# Patient Record
Sex: Male | Born: 1965 | Race: White | Hispanic: No | Marital: Single | Smoking: Current every day smoker
Health system: Southern US, Community
[De-identification: ages and names within clinical notes are randomized; demographics above are authoritative.]

## PROBLEM LIST (undated history)

## (undated) DIAGNOSIS — S149XXA Injury of unspecified nerves of neck, initial encounter: Secondary | ICD-10-CM

## (undated) DIAGNOSIS — G589 Mononeuropathy, unspecified: Secondary | ICD-10-CM

## (undated) DIAGNOSIS — R569 Unspecified convulsions: Secondary | ICD-10-CM

---

## 2018-04-10 ENCOUNTER — Emergency Department (HOSPITAL_COMMUNITY): Payer: Self-pay

## 2018-04-10 ENCOUNTER — Emergency Department (HOSPITAL_COMMUNITY)
Admission: EM | Admit: 2018-04-10 | Discharge: 2018-04-11 | Disposition: A | Discharge status: Home / Self Care | Payer: Self-pay | Attending: Emergency Medicine | Admitting: Emergency Medicine

## 2018-04-10 ENCOUNTER — Other Ambulatory Visit: Payer: Self-pay

## 2018-04-10 ENCOUNTER — Encounter (HOSPITAL_COMMUNITY): Payer: Self-pay

## 2018-04-10 DIAGNOSIS — F250 Schizoaffective disorder, bipolar type: Secondary | ICD-10-CM

## 2018-04-10 DIAGNOSIS — R0602 Shortness of breath: Secondary | ICD-10-CM | POA: Insufficient documentation

## 2018-04-10 DIAGNOSIS — R443 Hallucinations, unspecified: Secondary | ICD-10-CM

## 2018-04-10 DIAGNOSIS — Z046 Encounter for general psychiatric examination, requested by authority: Secondary | ICD-10-CM | POA: Insufficient documentation

## 2018-04-10 DIAGNOSIS — R440 Auditory hallucinations: Secondary | ICD-10-CM | POA: Insufficient documentation

## 2018-04-10 DIAGNOSIS — R45851 Suicidal ideations: Secondary | ICD-10-CM | POA: Insufficient documentation

## 2018-04-10 DIAGNOSIS — F329 Major depressive disorder, single episode, unspecified: Secondary | ICD-10-CM | POA: Insufficient documentation

## 2018-04-10 DIAGNOSIS — F17210 Nicotine dependence, cigarettes, uncomplicated: Secondary | ICD-10-CM | POA: Insufficient documentation

## 2018-04-10 DIAGNOSIS — R05 Cough: Secondary | ICD-10-CM | POA: Insufficient documentation

## 2018-04-10 DIAGNOSIS — E876 Hypokalemia: Secondary | ICD-10-CM | POA: Insufficient documentation

## 2018-04-10 DIAGNOSIS — Z590 Homelessness: Secondary | ICD-10-CM | POA: Insufficient documentation

## 2018-04-10 HISTORY — DX: Mononeuropathy, unspecified: G58.9

## 2018-04-10 HISTORY — DX: Injury of unspecified nerves of neck, initial encounter: S14.9XXA

## 2018-04-10 HISTORY — DX: Unspecified convulsions: R56.9

## 2018-04-10 LAB — CBC WITH DIFFERENTIAL/PLATELET
BASOS ABS: 0.1 10*3/uL (ref 0.0–0.1)
Basophils Relative: 1 %
EOS ABS: 0.3 10*3/uL (ref 0.0–0.7)
Eosinophils Relative: 4 %
HCT: 41.9 % (ref 39.0–52.0)
Hemoglobin: 13.9 g/dL (ref 13.0–17.0)
LYMPHS ABS: 2.1 10*3/uL (ref 0.7–4.0)
LYMPHS PCT: 23 %
MCH: 32.4 pg (ref 26.0–34.0)
MCHC: 33.2 g/dL (ref 30.0–36.0)
MCV: 97.7 fL (ref 78.0–100.0)
Monocytes Absolute: 0.8 10*3/uL (ref 0.1–1.0)
Monocytes Relative: 9 %
NEUTROS ABS: 5.8 10*3/uL (ref 1.7–7.7)
Neutrophils Relative %: 63 %
PLATELETS: 313 10*3/uL (ref 150–400)
RBC: 4.29 MIL/uL (ref 4.22–5.81)
RDW: 14.8 % (ref 11.5–15.5)
WBC: 9.1 10*3/uL (ref 4.0–10.5)

## 2018-04-10 LAB — RAPID URINE DRUG SCREEN, HOSP PERFORMED
Amphetamines: NOT DETECTED
Barbiturates: NOT DETECTED
Benzodiazepines: NOT DETECTED
Cocaine: NOT DETECTED
Opiates: NOT DETECTED
Tetrahydrocannabinol: POSITIVE — AB

## 2018-04-10 LAB — COMPREHENSIVE METABOLIC PANEL
ALT: 16 U/L (ref 0–44)
AST: 20 U/L (ref 15–41)
Albumin: 3.8 g/dL (ref 3.5–5.0)
Alkaline Phosphatase: 62 U/L (ref 38–126)
Anion gap: 8 (ref 5–15)
BILIRUBIN TOTAL: 0.4 mg/dL (ref 0.3–1.2)
BUN: 20 mg/dL (ref 6–20)
CALCIUM: 8.8 mg/dL — AB (ref 8.9–10.3)
CO2: 30 mmol/L (ref 22–32)
CREATININE: 0.99 mg/dL (ref 0.61–1.24)
Chloride: 107 mmol/L (ref 98–111)
GFR calc Af Amer: >60 mL/min (ref >60)
Glucose, Bld: 113 mg/dL — ABNORMAL HIGH (ref 70–99)
Potassium: 3.3 mmol/L — ABNORMAL LOW (ref 3.5–5.1)
Sodium: 145 mmol/L (ref 135–145)
Total Protein: 7.1 g/dL (ref 6.5–8.1)

## 2018-04-10 LAB — ACETAMINOPHEN LEVEL

## 2018-04-10 LAB — ETHANOL

## 2018-04-10 LAB — SALICYLATE LEVEL

## 2018-04-10 MED ORDER — POTASSIUM CHLORIDE CRYS ER 20 MEQ PO TBCR
40.0000 meq | EXTENDED_RELEASE_TABLET | Freq: Once | ORAL | Status: AC
  Administered 2018-04-10: 40 meq via ORAL
  Filled 2018-04-10: qty 2

## 2018-04-10 MED ORDER — LEVETIRACETAM 500 MG PO TABS
750.0000 mg | ORAL_TABLET | Freq: Two times a day (BID) | ORAL | Status: DC
  Administered 2018-04-10 – 2018-04-11 (×2): 750 mg via ORAL
  Filled 2018-04-10 (×2): qty 1

## 2018-04-10 MED ORDER — ACETAMINOPHEN 325 MG PO TABS: 650.0000 mg | ORAL_TABLET | ORAL | Status: DC | PRN

## 2018-04-10 MED ORDER — HALOPERIDOL 5 MG PO TABS
10.0000 mg | ORAL_TABLET | Freq: Two times a day (BID) | ORAL | Status: DC
  Administered 2018-04-10 – 2018-04-11 (×2): 10 mg via ORAL
  Filled 2018-04-10 (×2): qty 2

## 2018-04-10 MED ORDER — NICOTINE 21 MG/24HR TD PT24
21.0000 mg | MEDICATED_PATCH | Freq: Every day | TRANSDERMAL | Status: DC
  Administered 2018-04-10 – 2018-04-11 (×2): 21 mg via TRANSDERMAL
  Filled 2018-04-10 (×2): qty 1

## 2018-04-10 MED ORDER — TRAZODONE HCL 50 MG PO TABS
50.0000 mg | ORAL_TABLET | Freq: Every day | ORAL | Status: DC
  Administered 2018-04-10: 50 mg via ORAL
  Filled 2018-04-10: qty 1

## 2018-04-10 MED ORDER — BENZTROPINE MESYLATE 1 MG PO TABS
1.0000 mg | ORAL_TABLET | Freq: Two times a day (BID) | ORAL | Status: DC
  Administered 2018-04-10 – 2018-04-11 (×2): 1 mg via ORAL
  Filled 2018-04-10 (×2): qty 1

## 2018-04-10 MED ORDER — HYDROXYZINE HCL 25 MG PO TABS: 25.0000 mg | ORAL_TABLET | Freq: Three times a day (TID) | ORAL | Status: DC | PRN

## 2018-04-10 NOTE — ED Notes (Signed)
Patient was brought in by his parole officer and currently has a ankle bracelet on.

## 2018-04-10 NOTE — ED Triage Notes (Addendum)
Patient reports that he has medications for hallucinations and has been taking them like he is suppose to. Patient states, The medicine has made the voices more quiet,but has not taken them away.I figure I could kill myself and make them go away." Patient states the voices tell him to kill himself and others." Patient reports that his plan is to "[place] or walk out in front of traffic." Patient denies any alcohol or drug use. Patient denies any HI thoughts and states, "It would not help the situation."

## 2018-04-10 NOTE — ED Provider Notes (Signed)
[facility]   CSN: [id number] Arrival date & time: 04/10/18  1337     History   Chief Complaint Chief Complaint  Patient presents with  . Suicidal    HPI Deandrae Wajda is a 52 y.o. male.  Avi Kerschner is a 52 y.o. Male with a history of seizures, hallucinations and depression, presents to the department for evaluation of suicidal ideations.  Patient reports that he is having persistent hallucinations, he intermittently has command auditory hallucinations that tell him to kill himself or others.  He reports despite being on regular medications and taking them as directed these voices have quieted down but still persists.  He reports he just cannot go on like this anymore and he wants to die.  He plans to jump in front of a truck or jump off of a bridge at the [place] station.  He denies HI.  Reports regular cigarette use, denies other drugs.  Denies any focal medical complaints today, no recent fevers or illnesses.  Reports that because of smoking he has some mild chronic shortness of breath and intermittent cough, denies chest pain, abdominal pain, nausea or vomiting, no headaches or vision changes.  Reports history of pinched nerve in his neck with intermittent pains associated with that but this is unchanged, and he denies any numbness or weakness in his extremities.     Past Medical History:  Diagnosis Date  . Pinched nerve in neck   . Seizures (HCC)     There are no active problems to display for this patient.   History reviewed. No pertinent surgical history.      Home Medications    Prior to Admission medications   Not on File    Family History Family History  Family history unknown: Yes    Social History Social History   Tobacco Use  . Smoking status: Current Every Day Smoker    Packs/day: 2.00    Types: Cigarettes  . Smokeless tobacco: Never Used  Substance Use Topics  . Alcohol use: Not Currently      Frequency: Never  . Drug use: Not Currently     Allergies   Patient has no known allergies.   Review of Systems Review of Systems  Constitutional: Negative for chills and fever.  HENT: Negative.   Eyes: Negative for visual disturbance.  Respiratory: Positive for shortness of breath. Negative for cough.   Cardiovascular: Negative for chest pain.  Gastrointestinal: Negative for abdominal pain, nausea and vomiting.  Genitourinary: Negative for dysuria.  Musculoskeletal: Negative for arthralgias and myalgias.  Skin: Negative for color change, rash and wound.  Neurological: Negative for dizziness, seizures, weakness, numbness and headaches.  Psychiatric/Behavioral: Positive for dysphoric mood, hallucinations and suicidal ideas.     Physical Exam Updated Vital Signs BP 115/62 (BP Location: Left Arm)   Pulse 91   Temp 97.9 F (36.6 C) (Oral)   Resp 16   Ht 6\' 1"  (1.854 m)   Wt 74.4 kg   SpO2 97%   BMI 21.64 kg/m   Physical Exam  Constitutional: He appears well-developed and well-nourished. No distress.  HENT:  Head: Normocephalic and atraumatic.  Mouth/Throat: Oropharynx is clear and moist.  Eyes: Pupils are equal, round, and reactive to light. EOM are normal. Right eye exhibits no discharge. Left eye exhibits no discharge.  Neck: Neck supple.  Cardiovascular: Normal rate, regular rhythm, normal heart sounds and intact distal pulses.  Pulmonary/Chest: Effort normal and breath sounds normal. No respiratory  distress. He has no wheezes. He has no rales.  Respirations equal and unlabored, patient able to speak in full sentences, lungs clear to auscultation bilaterally  Abdominal: Soft. Bowel sounds are normal. He exhibits no distension and no mass. There is no tenderness. There is no guarding.  Abdomen soft, nondistended, nontender to palpation in all quadrants without guarding or peritoneal signs  Musculoskeletal: He exhibits no edema or deformity.  Neurological: He is  alert. Coordination normal.  Speech is clear, able to follow commands CN III-XII intact Normal strength in upper and lower extremities bilaterally including dorsiflexion and plantar flexion, strong and equal grip strength Sensation normal to light and sharp touch Moves extremities without ataxia, coordination intact  Skin: Skin is warm and dry. Capillary refill takes less than 2 seconds. He is not diaphoretic.  Psychiatric: His speech is normal. He is withdrawn and actively hallucinating. He exhibits a depressed mood. He expresses suicidal ideation. He expresses no homicidal ideation. He expresses suicidal plans. He expresses no homicidal plans.  Nursing note and vitals reviewed.    ED Treatments / Results  Labs (all labs ordered are listed, but only abnormal results are displayed) Labs Reviewed  COMPREHENSIVE METABOLIC PANEL - Abnormal; Notable for the following components:      Result Value   Potassium 3.3 (*)    Glucose, Bld 113 (*)    Calcium 8.8 (*)    All other components within normal limits  CBC WITH DIFFERENTIAL/PLATELET  ETHANOL  RAPID URINE DRUG SCREEN, HOSP PERFORMED  ACETAMINOPHEN LEVEL  SALICYLATE LEVEL    EKG EKG Interpretation  Date/Time:  Sunday April 10 2018 15:05:24 EDT Ventricular Rate:  74 PR Interval:    QRS Duration: 91 QT Interval:  381 QTC Calculation: 423 R Axis:   91 Text Interpretation:  Sinus rhythm Borderline right axis deviation early repolarization. no ischemic appearance. no old comarison Confirmed by Arby Barrette [phone]) on 04/10/2018 3:21:26 PM   Radiology Dg Chest 2 View  Result Date: 04/10/2018 CLINICAL DATA:  Cough.  Auditory hallucinations EXAM: CHEST - 2 VIEW COMPARISON:  None. FINDINGS: There is slight scarring in the apices. There is no edema or consolidation. The heart size and pulmonary vascularity are normal. No adenopathy. No evident bone lesions. IMPRESSION: Slight apical scarring bilaterally. No edema or consolidation.  No evident adenopathy. Electronically Signed   By: Bretta Bang III M.D.   On: 04/10/2018 15:28    Procedures Procedures (including critical care time)  Medications Ordered in ED Medications  potassium chloride SA (K-DUR,KLOR-CON) CR tablet 40 mEq (has no administration in time range)     Initial Impression / Assessment and Plan / ED Course  I have reviewed the triage vital signs and the nursing notes.  Pertinent labs & imaging results that were available during my care of the patient were reviewed by me and considered in my medical decision making (see chart for details).  Presents with suicidal ideations with plan to jump off of a bridge at the [place] station or run out in front of a truck.  He has been having persistent hallucinations despite taking his medications regularly and reports he just cannot take it anymore.  Patient is acute danger to himself and will require psychiatric evaluation.  He denies any focal medical complaints reports some chronic shortness of breath associated with smoking.  No recent fevers or illnesses.  Denies chest pain, no abdominal pain, nausea, vomiting, headaches or vision changes.  No focal findings on exam and vitals are stable  here in the ED. Will get EKG medical screening labs and chest x-ray.  TTS consult placed.  EKG without concerning changes.  Chest x-ray shows some chronic biapical scarring but no evidence of acute cardiopulmonary disease.  No leukocytosis and normal hemoglobin mild hypokalemia which was replaced with oral potassium here in the ED no other acute electrolyte drink mention, normal renal and liver function.  UDS pending, salicylate, acetaminophen and ethanol levels unremarkable  At this time patient is medically cleared for psychiatric evaluation he has been placed under ED psych hold.  Awaiting TTS recommendations for appropriate disposition.  Final Clinical Impressions(s) / ED Diagnoses   Final diagnoses:  Suicidal ideation    Hallucinations  Hypokalemia    ED Discharge Orders    None       Dartha Lodge, [place] 04/10/18 1601    Arby Barrette, MD 04/20/18 [phone]

## 2018-04-10 NOTE — BH Assessment (Signed)
Tele Assessment Note   Patient Name: Joel Herman MRN: [id number] Referring Physician: Legrand Rams Location of Patient: [facility] Location of Provider: Behavioral Health TTS Department  Kartier Bennison is an 52 y.o. male present to [facility] accompanied by his parole officer with complaints of auditory / visual hallucinations. Patient has a mental health history of schizoaffective disorder, Bipolar Type. Patient started hearing and seeing shadows around the age of 5-years-old. Patient has attempted suicide once age 31-year-old via attempting to hang himself. Patient threatened suicide in 2005 or 2006 via threaten to shoot himself. Stated he had the gun to his head when his sister walked in the room and stopped him. Each suicidal ideation triggered by hearing voices and seeing shadows. Patient stated the voices instructs him to do bad things or tells him degrading things such as, 'your worthless, no one cares about you and your wasting your time living.' Patient recently released from prison February 07, 2018, after serving 11-years for assault with a deadly weapon by vehicle, hit and run and driving while license revoked. Patient has been without medication since released from prison. Report he has only been on his medication 3 days prescribed by Dr. Leonard Schwartz at [facility] in [facility]. Medication prescribed Haldol, Cogentin, and Keppra, patient unsure of dosage.Patient denies homicidal ideations. Suicidal ideations triggered by auditory / visual hallucinations. Patient unable to contract for safety.   Patient present anxious. Report he has been experiencing auditory / visual hallucinations with suicidal ideations past two weeks. Patient is homeless, he sleeps where he can and eats when he can. Patient denies having a support system. He's on probation post release for 18-months. Patient oriented 4x. Report he drinking alcohol and smokes marijuana daily, UDS's unable during time of assessment. Denies history of trauma.    Disposition: Elta Guadeloupe, NP, patient meets criteria for inpatient hospitalization      Diagnosis: F25.0   Schizoaffective disorder, Bipolar type  Past Medical History:  Past Medical History:  Diagnosis Date  . Pinched nerve in neck   . Seizures (HCC)     History reviewed. No pertinent surgical history.  Family History:  Family History  Family history unknown: Yes    Social History:  reports that he has been smoking cigarettes. He has been smoking about 2.00 packs per day. He has never used smokeless tobacco. He reports that he drank alcohol. He reports that he has current or past drug history.  Additional Social History:  Alcohol / Drug Use Pain Medications: see MAR Prescriptions: see MAR Over the Counter: see MAR History of alcohol / drug use?: Yes Longest period of sobriety (when/how long): n/a Substance #1 Name of Substance 1: THC 1 - Age of First Use: 14 1 - Amount (size/oz): varies 1 - Frequency: daily  1 - Duration: ongoing  1 - Last Use / Amount: 04/08/2018 Substance #2 Name of Substance 2: Alcohol  2 - Age of First Use: 14 2 - Amount (size/oz): varies 2 - Frequency: daily  2 - Duration: ongoing  2 - Last Use / Amount: 04/09/2018  CIWA: CIWA-Ar BP: 115/62 Pulse Rate: 91 COWS:    Allergies: No Known Allergies  Home Medications:  (Not in a hospital admission)  OB/GYN Status:  No LMP for male patient.  General Assessment Data Location of Assessment: [facility] TTS Assessment: In system Is this a Tele or Face-to-Face Assessment?: Face-to-Face Is this an Initial Assessment or a Re-assessment for this encounter?: Initial Assessment Patient Accompanied by:: N/A(probation officer ) Language  Other than English: No Living Arrangements: Homeless/Shelter What gender do you identify as?: Male Marital status: Single Maiden name: n/a Pregnancy Status: No Living Arrangements: Other (Comment)(homeless) Can pt return to current living arrangement?:  Yes Admission Status: Voluntary Is patient capable of signing voluntary admission?: Yes Referral Source: Self/Family/Friend Insurance type: self-pay     Crisis Care Plan Living Arrangements: Other (Comment)(homeless) Legal Guardian: Other:(self) Name of Psychiatrist: Dr. Leonard Schwartz - [facility] in [facility]  Name of Therapist: patient denies  Education Status Is patient currently in school?: No Is the patient employed, unemployed or receiving disability?: Unemployed  Risk to self with the past 6 months Suicidal Ideation: Yes-Currently Present(SI - triggered by hearing voices) Has patient been a risk to self within the past 6 months prior to admission? : Yes(patient report feeling SI triggered by voices 2 wks ) Suicidal Intent: No Has patient had any suicidal intent within the past 6 months prior to admission? : No Is patient at risk for suicide?: Yes(SI thoughts triggered by voices ) Suicidal Plan?: Yes-Currently Present Has patient had any suicidal plan within the past 6 months prior to admission? : No Specify Current Suicidal Plan: [place] Access to Means: Yes Specify Access to Suicidal Means: pt can walk to train tracks  What has been your use of drugs/alcohol within the last 12 months?: alcohol & THC  Previous Attempts/Gestures: Yes How many times?: 2 Other Self Harm Risks: pt denies  Triggers for Past Attempts: Other (Comment), Hallucinations(auditory / visual hallucinations ) Intentional Self Injurious Behavior: Damaging(smoking THC & drinking alcohol ) Comment - Self Injurious Behavior: smoking THC & drinking alcohol  Family Suicide History: No Recent stressful life event(s): Other (Comment)(released from prison, homeless ) Persecutory voices/beliefs?: No Depression: Yes Depression Symptoms: Feeling worthless/self pity Substance abuse history and/or treatment for substance abuse?: No Suicide prevention information given to non-admitted patients: Not  applicable  Risk to Others within the past 6 months Homicidal Ideation: No Does patient have any lifetime risk of violence toward others beyond the six months prior to admission? : No Thoughts of Harm to Others: No Current Homicidal Intent: No Current Homicidal Plan: No Access to Homicidal Means: No Identified Victim: n/a History of harm to others?: No Assessment of Violence: None Noted Violent Behavior Description: None Noted  Does patient have access to weapons?: No Criminal Charges Pending?: No Does patient have a court date: No Is patient on probation?: Yes  Psychosis Hallucinations: Auditory, Visual Delusions: None noted  Mental Status Report Appearance/Hygiene: Poor hygiene Eye Contact: Fair Motor Activity: Freedom of movement Speech: Logical/coherent Level of Consciousness: Alert Mood: Anxious Affect: Anxious Anxiety Level: None Thought Processes: Coherent, Relevant Judgement: Impaired(SI thoughts triggered by auditory / visual hallucinations ) Orientation: Person, Place, Time, Situation Obsessive Compulsive Thoughts/Behaviors: None  Cognitive Functioning Concentration: Normal Memory: Recent Intact, Remote Intact Is patient IDD: No Insight: Fair Impulse Control: Poor Appetite: Poor Have you had any weight changes? : No Change Sleep: Decreased Total Hours of Sleep: 3(report 3 to 4 hours sleep when can find somewhere to sleep ) Vegetative Symptoms: None  ADLScreening [facility] Assessment Services) Patient's cognitive ability adequate to safely complete daily activities?: Yes Patient able to express need for assistance with ADLs?: Yes Independently performs ADLs?: Yes (appropriate for developmental age)  Prior Inpatient Therapy Prior Inpatient Therapy: Yes Prior Therapy Dates: 1987 [facility]  Reason for Treatment: mental health   Prior Outpatient Therapy Prior Outpatient Therapy: Yes Prior Therapy Dates: unknown  Prior  Therapy Facilty/Provider(s): [facility]  Reason for Treatment: mental health  Does patient have an ACCT team?: No Does patient have Intensive In-House Services?  : No Does patient have [facility] services? : No Does patient have P4CC services?: No  ADL Screening (condition at time of admission) Patient's cognitive ability adequate to safely complete daily activities?: Yes Is the patient deaf or have difficulty hearing?: No Does the patient have difficulty seeing, even when wearing glasses/contacts?: No Does the patient have difficulty concentrating, remembering, or making decisions?: No Patient able to express need for assistance with ADLs?: Yes Does the patient have difficulty dressing or bathing?: No Independently performs ADLs?: Yes (appropriate for developmental age) Does the patient have difficulty walking or climbing stairs?: No             Advance Directives (For Healthcare) Does Patient Have a Medical Advance Directive?: No Would patient like information on creating a medical advance directive?: No - Patient declined          Disposition:  Disposition Initial Assessment Completed for this Encounter: Kandis Nab, NP, )  This service was provided via telemedicine using a 2-way, interactive audio and Immunologist.  Names of all persons participating in this telemedicine service and their role in this encounter. Name: Oluwadamilola Rosamond Role: patient  Name: Vinetta Bergamo Role: TTS assessor   Name:  Role:   Name:  Role:     Dian Situ 04/10/2018 3:27 PM

## 2018-04-10 NOTE — BHH Counselor (Signed)
Patient assessed and case reviewed with Elta Guadeloupe, NP, who states patient meets criteria for inpatient hospitalizations. Patient will be faxed out and appropriate beds investigated.

## 2018-04-10 NOTE — ED Notes (Signed)
Pt sleeping at present, no distress noted, calm & cooperative. Pt is SI, hearing voices.  Monitoring for safety, Q 15 min checks in effect.

## 2018-04-10 NOTE — ED Notes (Addendum)
Pt to room #41. Pt pleasant on approach. Endorsing SI/AH. Pt reports he was recently kicked out of the shelter and is now homeless. Informs nurse that parole officer encouraged him to come to hospital d/t his thoughts. Ankle monitor present.  Encouragement and support provided. Special checks q 15 mins in place for safety, Video monitoring in place. Will continue to montior.

## 2018-04-11 DIAGNOSIS — F250 Schizoaffective disorder, bipolar type: Secondary | ICD-10-CM

## 2018-04-11 DIAGNOSIS — F17210 Nicotine dependence, cigarettes, uncomplicated: Secondary | ICD-10-CM

## 2018-04-11 DIAGNOSIS — R443 Hallucinations, unspecified: Secondary | ICD-10-CM

## 2018-04-11 NOTE — Discharge Instructions (Signed)
For your behavioral health needs, you are advised to continue treatment with [facility] in [facility]:       [facility]      [address]      [place], [place] [address]       [phone]

## 2018-04-11 NOTE — ED Notes (Signed)
Pt d/c home per MD order. Discharge summary reviewed with pt, pt verbalizes understanding. Denies SI/HI/AVH. Pt signed e-signature. Personal property returned. Ambulatory off unit.
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
## 2018-04-11 NOTE — BH Assessment (Signed)
[facility] Assessment Progress Note  Per Juanetta Beets, DO, this pt does not require psychiatric hospitalization at this time.  Pt is to be discharged from [facility] with recommendation to continue treatment with [facility] in [facility].  This has been included in pt's discharge instructions.  Pt's nurse, Morrie Sheldon, has been notified.  Doylene Canning, MA Triage Specialist [phone]

## 2018-04-12 DIAGNOSIS — F250 Schizoaffective disorder, bipolar type: Secondary | ICD-10-CM

## 2018-04-12 DIAGNOSIS — R443 Hallucinations, unspecified: Secondary | ICD-10-CM | POA: Insufficient documentation

## 2018-08-30 ENCOUNTER — Encounter (HOSPITAL_COMMUNITY): Payer: Self-pay

## 2018-08-30 ENCOUNTER — Emergency Department (HOSPITAL_COMMUNITY)
Admission: EM | Admit: 2018-08-30 | Discharge: 2018-08-31 | Disposition: A | Discharge status: Home / Self Care | Payer: No Typology Code available for payment source | Attending: Emergency Medicine | Admitting: Emergency Medicine

## 2018-08-30 ENCOUNTER — Emergency Department (HOSPITAL_COMMUNITY): Payer: No Typology Code available for payment source

## 2018-08-30 ENCOUNTER — Other Ambulatory Visit: Payer: Self-pay

## 2018-08-30 DIAGNOSIS — G44319 Acute post-traumatic headache, not intractable: Secondary | ICD-10-CM | POA: Insufficient documentation

## 2018-08-30 DIAGNOSIS — F17210 Nicotine dependence, cigarettes, uncomplicated: Secondary | ICD-10-CM | POA: Diagnosis not present

## 2018-08-30 DIAGNOSIS — Y999 Unspecified external cause status: Secondary | ICD-10-CM | POA: Diagnosis not present

## 2018-08-30 DIAGNOSIS — Z79899 Other long term (current) drug therapy: Secondary | ICD-10-CM | POA: Diagnosis not present

## 2018-08-30 DIAGNOSIS — S161XXA Strain of muscle, fascia and tendon at neck level, initial encounter: Secondary | ICD-10-CM | POA: Diagnosis not present

## 2018-08-30 DIAGNOSIS — Y9389 Activity, other specified: Secondary | ICD-10-CM | POA: Diagnosis not present

## 2018-08-30 DIAGNOSIS — Y92410 Unspecified street and highway as the place of occurrence of the external cause: Secondary | ICD-10-CM | POA: Diagnosis not present

## 2018-08-30 DIAGNOSIS — S199XXA Unspecified injury of neck, initial encounter: Secondary | ICD-10-CM | POA: Diagnosis present

## 2018-08-30 MED ORDER — OXYCODONE-ACETAMINOPHEN 5-325 MG PO TABS
1.0000 | ORAL_TABLET | Freq: Once | ORAL | Status: AC
  Administered 2018-08-30: 1 via ORAL
  Filled 2018-08-30: qty 1
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
## 2018-08-30 NOTE — ED Notes (Signed)
Patient transported to CT 
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
## 2018-08-31 MED ORDER — KETOROLAC TROMETHAMINE 30 MG/ML IJ SOLN
30.0000 mg | Freq: Once | INTRAMUSCULAR | Status: AC
  Administered 2018-08-31: 30 mg via INTRAVENOUS
  Filled 2018-08-31: qty 1

## 2018-08-31 MED ORDER — SODIUM CHLORIDE 0.9 % IV BOLUS
1000.0000 mL | Freq: Once | INTRAVENOUS | Status: AC
  Administered 2018-08-31: 1000 mL via INTRAVENOUS

## 2018-08-31 MED ORDER — NAPROXEN 500 MG PO TABS: 500.0000 mg | ORAL_TABLET | Freq: Two times a day (BID) | ORAL | 0 refills | Status: AC

## 2018-08-31 MED ORDER — DIPHENHYDRAMINE HCL 50 MG/ML IJ SOLN
25.0000 mg | Freq: Once | INTRAMUSCULAR | Status: AC
  Administered 2018-08-31: 25 mg via INTRAVENOUS
  Filled 2018-08-31: qty 1

## 2018-08-31 MED ORDER — PROCHLORPERAZINE EDISYLATE 10 MG/2ML IJ SOLN
10.0000 mg | Freq: Once | INTRAMUSCULAR | Status: AC
  Administered 2018-08-31: 10 mg via INTRAVENOUS
  Filled 2018-08-31: qty 2

## 2018-08-31 MED ORDER — CYCLOBENZAPRINE HCL 10 MG PO TABS: 10.0000 mg | ORAL_TABLET | Freq: Two times a day (BID) | ORAL | 0 refills | Status: AC | PRN
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
## 2019-04-01 ENCOUNTER — Other Ambulatory Visit: Payer: Self-pay

## 2019-04-01 ENCOUNTER — Encounter (HOSPITAL_COMMUNITY): Payer: Self-pay

## 2019-04-01 ENCOUNTER — Emergency Department (HOSPITAL_COMMUNITY)
Admission: EM | Admit: 2019-04-01 | Discharge: 2019-04-01 | Disposition: A | Discharge status: Home / Self Care | Payer: Self-pay | Attending: Emergency Medicine | Admitting: Emergency Medicine

## 2019-04-01 DIAGNOSIS — Z5321 Procedure and treatment not carried out due to patient leaving prior to being seen by health care provider: Secondary | ICD-10-CM | POA: Insufficient documentation

## 2019-04-01 DIAGNOSIS — F10929 Alcohol use, unspecified with intoxication, unspecified: Secondary | ICD-10-CM | POA: Insufficient documentation

## 2019-04-01 NOTE — ED Triage Notes (Signed)
Pt presents to ED for ETOH intoxication. Ambulating with 1 person assist. Pt would not verbalized a chief complaint.

## 2019-04-01 NOTE — ED Notes (Signed)
Pt states he has a ride and that he is leaving. Staff tried to persuade pt to stay and be evaluated. Pt refused eval and treatment

## 2019-04-01 NOTE — ED Notes (Signed)
Pt dialed a phone, organized a ride to pick him up and ambulated out of ED without any assistance. He states that he does not want to be seen.

## 2019-05-12 ENCOUNTER — Emergency Department (HOSPITAL_COMMUNITY): Payer: Self-pay

## 2019-05-12 ENCOUNTER — Other Ambulatory Visit: Payer: Self-pay

## 2019-05-12 ENCOUNTER — Emergency Department (HOSPITAL_COMMUNITY)
Admission: EM | Admit: 2019-05-12 | Discharge: 2019-05-12 | Disposition: A | Discharge status: Home / Self Care | Payer: Self-pay | Attending: Emergency Medicine | Admitting: Emergency Medicine

## 2019-05-12 ENCOUNTER — Encounter (HOSPITAL_COMMUNITY): Payer: Self-pay | Admitting: Emergency Medicine

## 2019-05-12 DIAGNOSIS — F17210 Nicotine dependence, cigarettes, uncomplicated: Secondary | ICD-10-CM | POA: Insufficient documentation

## 2019-05-12 DIAGNOSIS — Z20828 Contact with and (suspected) exposure to other viral communicable diseases: Secondary | ICD-10-CM | POA: Insufficient documentation

## 2019-05-12 DIAGNOSIS — J069 Acute upper respiratory infection, unspecified: Secondary | ICD-10-CM | POA: Insufficient documentation

## 2019-05-12 DIAGNOSIS — Z79899 Other long term (current) drug therapy: Secondary | ICD-10-CM | POA: Insufficient documentation

## 2019-05-12 MED ORDER — BENZONATATE 100 MG PO CAPS: 100.0000 mg | ORAL_CAPSULE | Freq: Three times a day (TID) | ORAL | 0 refills | Status: DC

## 2019-05-12 MED ORDER — ACETAMINOPHEN 500 MG PO TABS: 500.0000 mg | ORAL_TABLET | Freq: Four times a day (QID) | ORAL | 0 refills | Status: AC | PRN

## 2019-05-12 MED ORDER — LEVETIRACETAM 750 MG PO TABS: 750.0000 mg | ORAL_TABLET | Freq: Two times a day (BID) | ORAL | 1 refills | Status: AC

## 2019-05-12 MED ORDER — ACETAMINOPHEN 500 MG PO TABS: 500.0000 mg | ORAL_TABLET | Freq: Four times a day (QID) | ORAL | 0 refills | Status: DC | PRN

## 2019-05-12 MED ORDER — BENZONATATE 100 MG PO CAPS: 100.0000 mg | ORAL_CAPSULE | Freq: Three times a day (TID) | ORAL | 0 refills | Status: AC
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
## 2019-05-12 NOTE — ED Notes (Signed)
Social work at bedside.  

## 2019-05-12 NOTE — ED Notes (Signed)
Pt verbalized understanding of discharge instructions. Prescriptions reviewed, pt had no further questions at this time. 

## 2019-05-12 NOTE — ED Notes (Signed)
Pt is homeless and does not have a cell phone to receive COVID results. Pt would like results to be called to [facility] or Ailene Ravel at the [employer] at [phone]

## 2019-05-12 NOTE — ED Triage Notes (Signed)
Pt homeless, in with c/o productive cough x 2 wks, fatigue, sob and congestion. States he has been rained on frequently past few days, has been having chills. Temp 98.5 in triage

## 2019-05-12 NOTE — Discharge Instructions (Addendum)
Please follow-up with the outpatient psycho social clinic that you were supposed to see today.  However, given your symptoms, will want you to maintain isolation precautions pending results of your testing.  Please return to the ED should you develop any worsening fevers or chills, difficulty breathing, new chest pain, uncontrolled nausea vomiting, or any other new or worsening symptoms.   If you live with, or provide care at home for, a person confirmed to have, or being evaluated for, COVID-19 infection please follow these guidelines to prevent infection:  Follow healthcare providers instructions Make sure that you understand and can help the patient follow any healthcare provider instructions for all care.  Provide for the patients basic needs You should help the patient with basic needs in the home and provide support for getting groceries, prescriptions, and other personal needs.  Monitor the patients symptoms If they are getting sicker, call his or her medical provider a  This will help the healthcare providers office take steps to keep other people from getting infected. Ask the healthcare provider to call the local or state health department.  Limit the number of people who have contact with the patient If possible, have only one caregiver for the patient. Other household members should stay in another home or place of residence. If this is not possible, they should stay in another room, or be separated from the patient as much as possible. Use a separate bathroom, if available. Restrict visitors who do not have an essential need to be in the home.  Keep older adults, very young children, and other sick people away from the patient Keep older adults, very young children, and those who have compromised immune systems or chronic health conditions away from the patient. This includes people with chronic heart, lung, or kidney conditions, diabetes, and cancer.  Ensure good  ventilation Make sure that shared spaces in the home have good air flow, such as from an air conditioner or an opened window, weather permitting.  Wash your hands often Wash your hands often and thoroughly with soap and water for at least 20 seconds. You can use an alcohol based hand sanitizer if soap and water are not available and if your hands are not visibly dirty. Avoid touching your eyes, nose, and mouth with unwashed hands. Use disposable paper towels to dry your hands. If not available, use dedicated cloth towels and replace them when they become wet.  Wear a facemask and gloves Wear a disposable facemask at all times in the room and gloves when you touch or have contact with the patients blood, body fluids, and/or secretions or excretions, such as sweat, saliva, sputum, nasal mucus, vomit, urine, or feces.  Ensure the mask fits over your nose and mouth tightly, and do not touch it during use. Throw out disposable facemasks and gloves after using them. Do not reuse. Wash your hands immediately after removing your facemask and gloves. If your personal clothing becomes contaminated, carefully remove clothing and launder. Wash your hands after handling contaminated clothing. Place all used disposable facemasks, gloves, and other waste in a lined container before disposing them with other household waste. Remove gloves and wash your hands immediately after handling these items.  Do not share dishes, glasses, or other household items with the patient Avoid sharing household items. You should not share dishes, drinking glasses, cups, eating utensils, towels, bedding, or other items After the person uses these items, you should wash them thoroughly with soap and water.  [employer]  thoroughly Immediately remove and wash clothes or bedding that have blood, body fluids, and/or secretions or excretions, such as sweat, saliva, sputum, nasal mucus, vomit, urine, or feces, on them. Wear gloves  when handling laundry from the patient. Read and follow directions on labels of laundry or clothing items and detergent. In general, wash and dry with the warmest temperatures recommended on the label.  Clean all areas the individual has used often Clean all touchable surfaces, such as counters, tabletops, doorknobs, bathroom fixtures, toilets, phones, keyboards, tablets, and bedside tables, every day. Also, clean any surfaces that may have blood, body fluids, and/or secretions or excretions on them. Wear gloves when cleaning surfaces the patient has come in contact with. Use a diluted bleach solution (e.g., dilute bleach with 1 part bleach and 10 parts water) or a household disinfectant with a label that says EPA-registered for coronaviruses. To make a bleach solution at home, add 1 tablespoon of bleach to 1 quart (4 cups) of water. For a larger supply, add  cup of bleach to 1 gallon (16 cups) of water. Read labels of cleaning products and follow recommendations provided on product labels. Labels contain instructions for safe and effective use of the cleaning product including precautions you should take when applying the product, such as wearing gloves or eye protection and making sure you have good ventilation during use of the product. Remove gloves and wash hands immediately after cleaning.  Monitor yourself for signs and symptoms of illness Caregivers and household members are considered close contacts, should monitor their health, and will be asked to limit movement outside of the home to the extent possible. Follow the monitoring steps for close contacts listed on the symptom monitoring form.   ? If you have additional questions, contact your local health department or call the epidemiologist on call at [phone] (available 24/7). ? This guidance is subject to change. For the most up-to-date guidance from [facility], please refer to their  website: [email or web address]
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
## 2019-05-13 LAB — NOVEL CORONAVIRUS, NAA (HOSP ORDER, SEND-OUT TO REF LAB; TAT 18-24 HRS): SARS-CoV-2, NAA: NOT DETECTED

## 2020-05-18 IMAGING — CR DG CHEST 2V
2 series · 2 of 2 positions shown · non-contrast
Comparison: None.

CLINICAL DATA: Cough.  Auditory hallucinations

EXAM:
CHEST - 2 VIEW

[w chest pa]
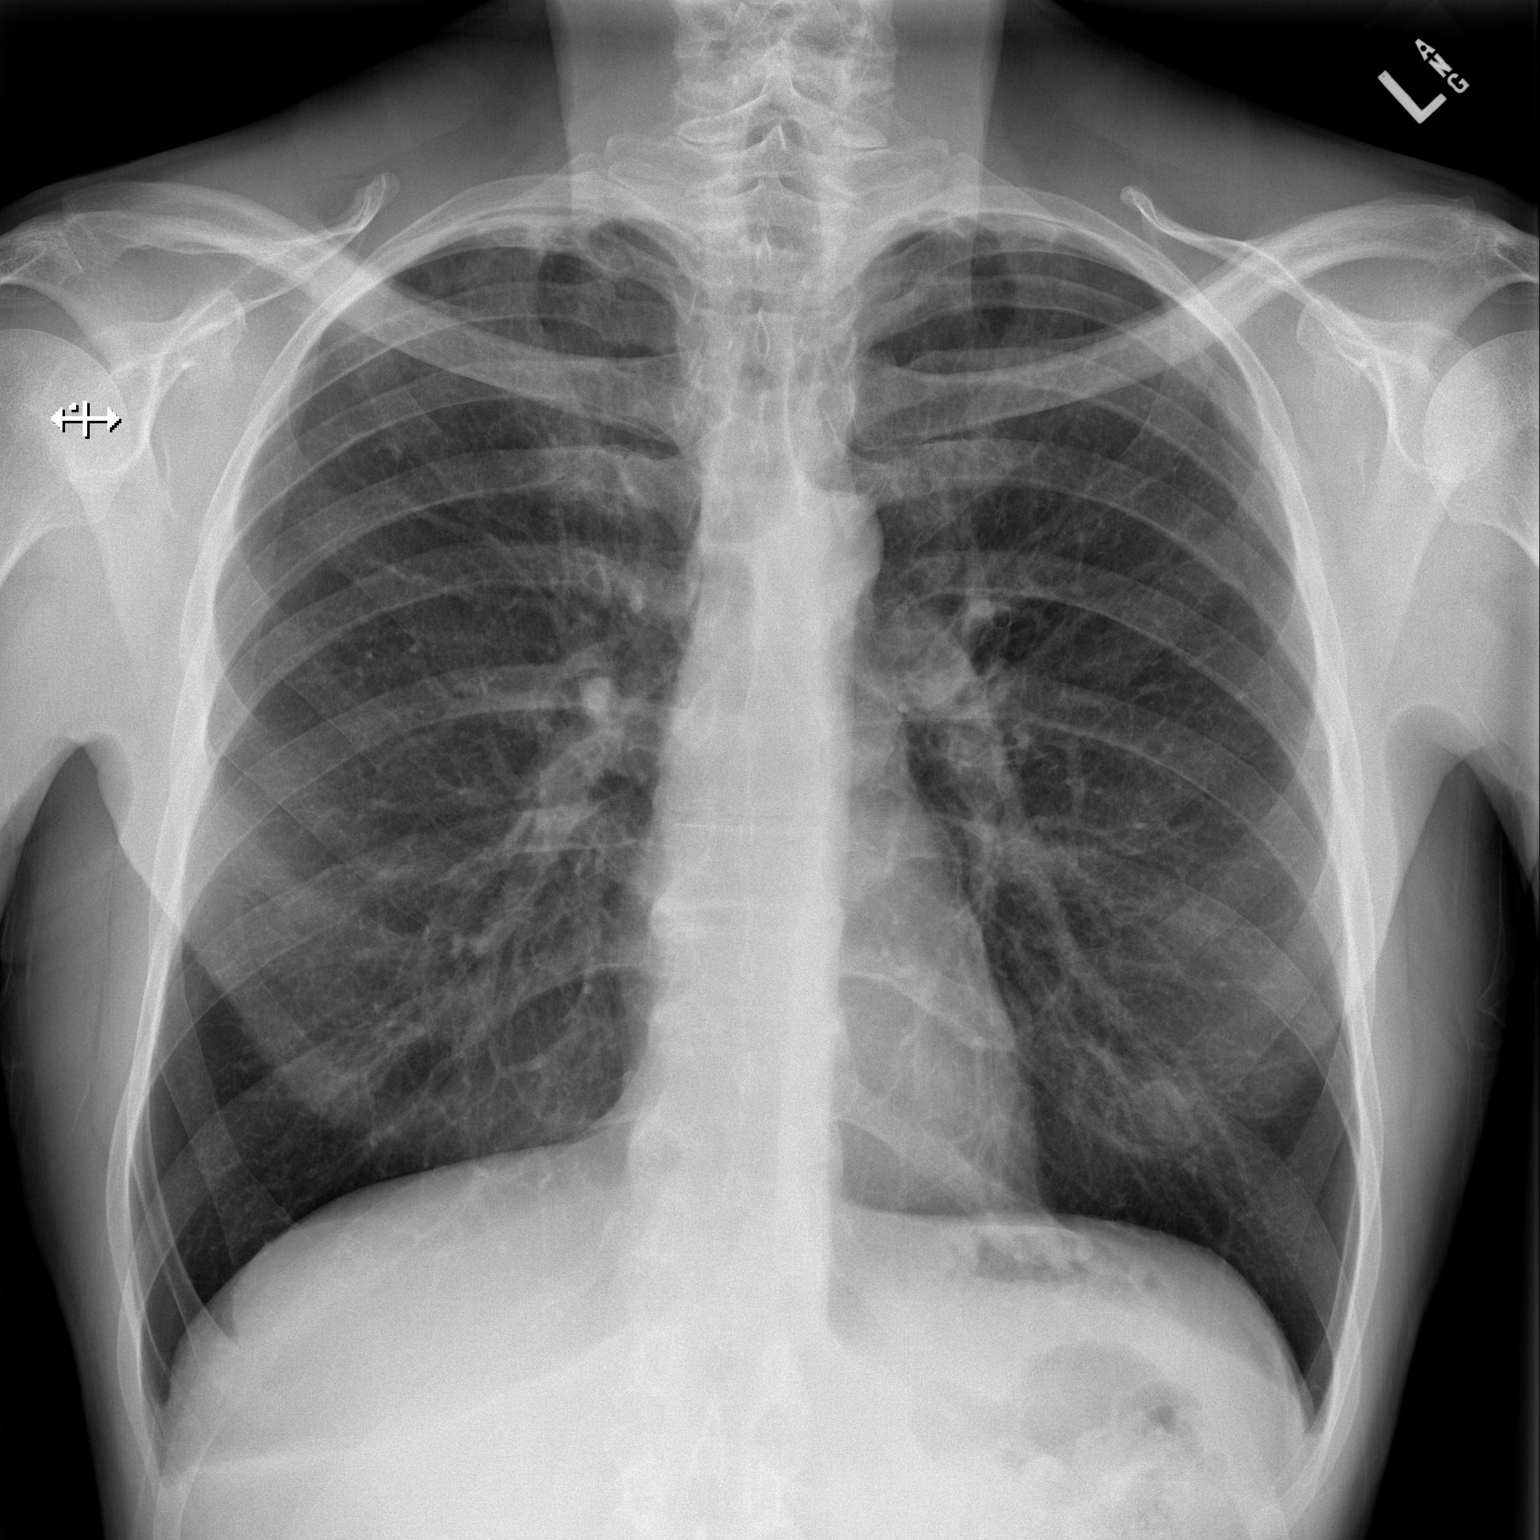

[w chest lat]
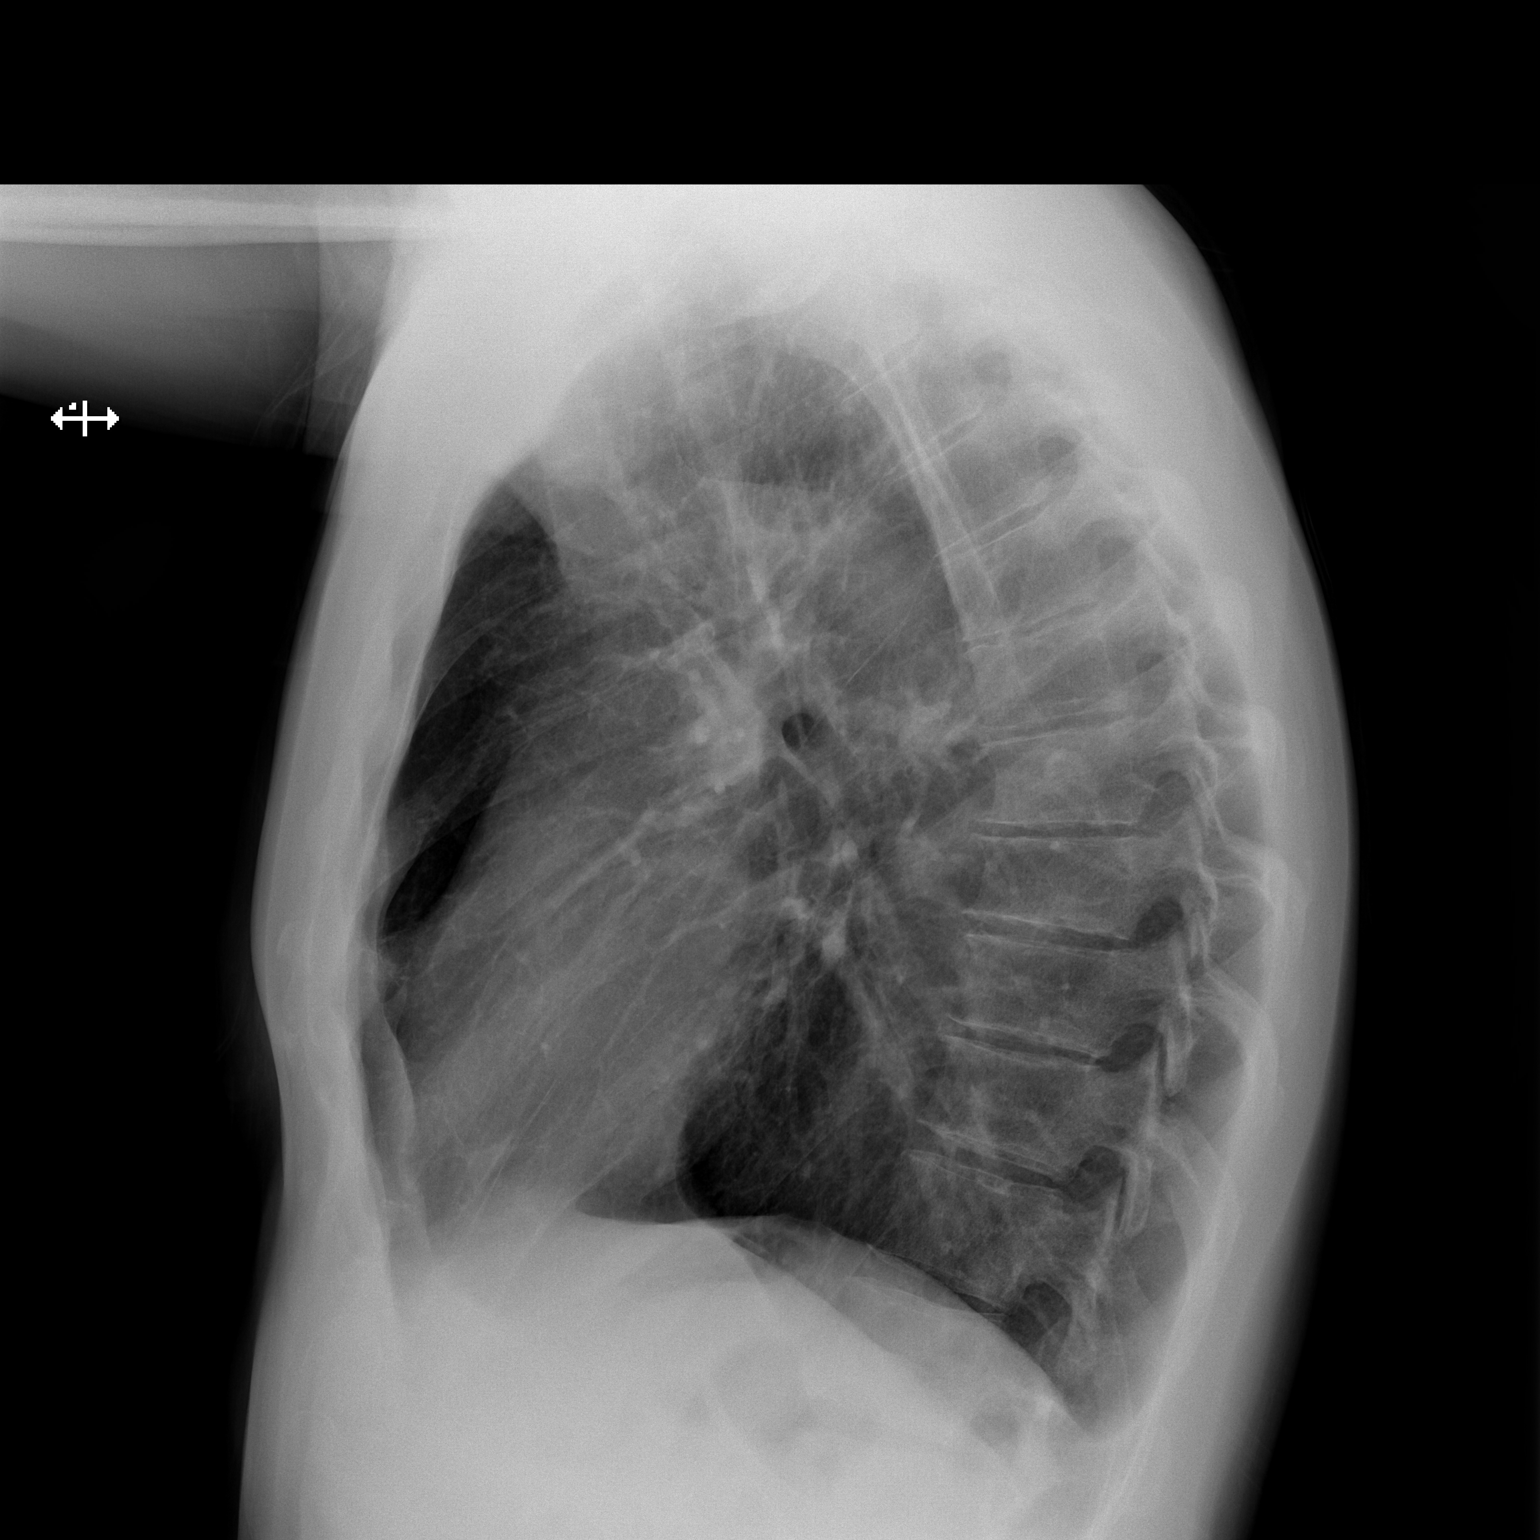

[2 of 2 positions shown; findings below may reference images not displayed]

FINDINGS: There is slight scarring in the apices. There is no edema or
consolidation. The heart size and pulmonary vascularity are normal.
No adenopathy. No evident bone lesions.
IMPRESSION: Slight apical scarring bilaterally. No edema or consolidation. No
evident adenopathy.
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
## 2023-12-21 ENCOUNTER — Ambulatory Visit (HOSPITAL_COMMUNITY): Payer: Self-pay | Admitting: Licensed Clinical Social Worker
